# Patient Record
Sex: Male | Born: 1990 | Race: White | Hispanic: No | Marital: Single | Smoking: Current every day smoker
Health system: Southern US, Community
[De-identification: ages and names within clinical notes are randomized; demographics above are authoritative.]

---
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
## 2016-08-17 ENCOUNTER — Encounter (HOSPITAL_COMMUNITY): Payer: Self-pay | Admitting: Emergency Medicine

## 2016-08-17 ENCOUNTER — Ambulatory Visit (HOSPITAL_COMMUNITY)
Admission: EM | Admit: 2016-08-17 | Discharge: 2016-08-18 | Disposition: A | Discharge status: Home / Self Care | Payer: Self-pay | Attending: Orthopedic Surgery | Admitting: Orthopedic Surgery

## 2016-08-17 ENCOUNTER — Encounter (HOSPITAL_COMMUNITY): Payer: Self-pay | Admitting: Certified Registered Nurse Anesthetist

## 2016-08-17 ENCOUNTER — Emergency Department (HOSPITAL_COMMUNITY): Payer: Self-pay

## 2016-08-17 DIAGNOSIS — F17210 Nicotine dependence, cigarettes, uncomplicated: Secondary | ICD-10-CM | POA: Insufficient documentation

## 2016-08-17 DIAGNOSIS — S62609A Fracture of unspecified phalanx of unspecified finger, initial encounter for closed fracture: Secondary | ICD-10-CM

## 2016-08-17 DIAGNOSIS — Y93H3 Activity, building and construction: Secondary | ICD-10-CM | POA: Insufficient documentation

## 2016-08-17 DIAGNOSIS — S62625B Displaced fracture of medial phalanx of left ring finger, initial encounter for open fracture: Secondary | ICD-10-CM | POA: Insufficient documentation

## 2016-08-17 DIAGNOSIS — W298XXA Contact with other powered powered hand tools and household machinery, initial encounter: Secondary | ICD-10-CM | POA: Insufficient documentation

## 2016-08-17 DIAGNOSIS — Z23 Encounter for immunization: Secondary | ICD-10-CM | POA: Insufficient documentation

## 2016-08-17 DIAGNOSIS — S63295A Dislocation of distal interphalangeal joint of left ring finger, initial encounter: Secondary | ICD-10-CM | POA: Insufficient documentation

## 2016-08-17 DIAGNOSIS — Z79899 Other long term (current) drug therapy: Secondary | ICD-10-CM | POA: Insufficient documentation

## 2016-08-17 DIAGNOSIS — S61215A Laceration without foreign body of left ring finger without damage to nail, initial encounter: Secondary | ICD-10-CM

## 2016-08-17 DIAGNOSIS — S62635B Displaced fracture of distal phalanx of left ring finger, initial encounter for open fracture: Secondary | ICD-10-CM | POA: Diagnosis present

## 2016-08-17 LAB — BASIC METABOLIC PANEL
Anion gap: 8 (ref 5–15)
BUN: 15 mg/dL (ref 6–20)
CHLORIDE: 102 mmol/L (ref 101–111)
CO2: 28 mmol/L (ref 22–32)
CREATININE: 1.09 mg/dL (ref 0.61–1.24)
Calcium: 9.1 mg/dL (ref 8.9–10.3)
GFR calc Af Amer: >60 mL/min (ref >60)
GFR calc non Af Amer: >60 mL/min (ref >60)
GLUCOSE: 127 mg/dL — AB (ref 65–99)
Potassium: 3.3 mmol/L — ABNORMAL LOW (ref 3.5–5.1)
SODIUM: 138 mmol/L (ref 135–145)

## 2016-08-17 LAB — CBC WITH DIFFERENTIAL/PLATELET
Basophils Absolute: 0 10*3/uL (ref 0.0–0.1)
Basophils Relative: 0 %
EOS ABS: 0.2 10*3/uL (ref 0.0–0.7)
EOS PCT: 2 %
HEMATOCRIT: 45 % (ref 39.0–52.0)
HEMOGLOBIN: 15.8 g/dL (ref 13.0–17.0)
LYMPHS ABS: 3.2 10*3/uL (ref 0.7–4.0)
Lymphocytes Relative: 33 %
MCH: 30.7 pg (ref 26.0–34.0)
MCHC: 35.1 g/dL (ref 30.0–36.0)
MCV: 87.4 fL (ref 78.0–100.0)
MONO ABS: 0.6 10*3/uL (ref 0.1–1.0)
MONOS PCT: 6 %
Neutro Abs: 5.5 10*3/uL (ref 1.7–7.7)
Neutrophils Relative %: 59 %
Platelets: 153 10*3/uL (ref 150–400)
RBC: 5.15 MIL/uL (ref 4.22–5.81)
RDW: 12.9 % (ref 11.5–15.5)
WBC: 9.5 10*3/uL (ref 4.0–10.5)

## 2016-08-17 MED ORDER — CEFAZOLIN SODIUM-DEXTROSE 2-4 GM/100ML-% IV SOLN
INTRAVENOUS | Status: AC
  Filled 2016-08-17: qty 100

## 2016-08-17 MED ORDER — MORPHINE SULFATE (PF) 4 MG/ML IV SOLN
4.0000 mg | Freq: Once | INTRAVENOUS | Status: AC
  Administered 2016-08-17: 4 mg via INTRAVENOUS
  Filled 2016-08-17: qty 1

## 2016-08-17 MED ORDER — BUPIVACAINE HCL 0.5 % IJ SOLN
5.0000 mL | Freq: Once | INTRAMUSCULAR | Status: AC
  Administered 2016-08-17: 5 mL
  Filled 2016-08-17: qty 5

## 2016-08-17 MED ORDER — DEXAMETHASONE SODIUM PHOSPHATE 10 MG/ML IJ SOLN
INTRAMUSCULAR | Status: AC
  Filled 2016-08-17: qty 1

## 2016-08-17 MED ORDER — SODIUM CHLORIDE 0.9 % IV BOLUS (SEPSIS)
1000.0000 mL | Freq: Once | INTRAVENOUS | Status: AC
  Administered 2016-08-17: 1000 mL via INTRAVENOUS

## 2016-08-17 MED ORDER — ONDANSETRON HCL 4 MG/2ML IJ SOLN
INTRAMUSCULAR | Status: AC
  Filled 2016-08-17: qty 2

## 2016-08-17 MED ORDER — CEFAZOLIN IN D5W 1 GM/50ML IV SOLN
1.0000 g | INTRAVENOUS | Status: AC
  Administered 2016-08-18: 1 g via INTRAVENOUS

## 2016-08-17 MED ORDER — FENTANYL CITRATE (PF) 100 MCG/2ML IJ SOLN
INTRAMUSCULAR | Status: AC
  Filled 2016-08-17: qty 2

## 2016-08-17 MED ORDER — MIDAZOLAM HCL 2 MG/2ML IJ SOLN
INTRAMUSCULAR | Status: AC
  Filled 2016-08-17: qty 2

## 2016-08-17 MED ORDER — TETANUS-DIPHTH-ACELL PERTUSSIS 5-2.5-18.5 LF-MCG/0.5 IM SUSP
0.5000 mL | Freq: Once | INTRAMUSCULAR | Status: AC
  Administered 2016-08-17: 0.5 mL via INTRAMUSCULAR
  Filled 2016-08-17: qty 0.5

## 2016-08-17 MED ORDER — LIDOCAINE 2% (20 MG/ML) 5 ML SYRINGE
INTRAMUSCULAR | Status: AC
  Filled 2016-08-17: qty 5

## 2016-08-17 MED ORDER — CEFAZOLIN IN D5W 1 GM/50ML IV SOLN
1.0000 g | Freq: Once | INTRAVENOUS | Status: AC
  Administered 2016-08-17: 1 g via INTRAVENOUS
  Filled 2016-08-17: qty 50

## 2016-08-17 MED ORDER — ONDANSETRON HCL 4 MG/2ML IJ SOLN
4.0000 mg | Freq: Once | INTRAMUSCULAR | Status: AC
  Administered 2016-08-17: 4 mg via INTRAVENOUS
  Filled 2016-08-17: qty 2

## 2016-08-17 MED ORDER — PROPOFOL 10 MG/ML IV BOLUS
INTRAVENOUS | Status: AC
  Filled 2016-08-17: qty 20

## 2016-08-17 NOTE — Consult Note (Signed)
ORTHOPAEDIC CONSULTATION HISTORY & PHYSICAL REQUESTING PHYSICIAN: Rolland Porter, MD  Chief Complaint: left ring finger injury  HPI: Raymond Clark is a 26 y.o. male who was using an angle grinder on wood when his left ring finger was injured.  He presented to [facility] ED for treatment.  Ancef has been administered and his tetanus has been updated.  He has an open wound dorsally on the digit and the tip is floppy  History reviewed. No pertinent past medical history. History reviewed. No pertinent surgical history. Social History   Social History  . Marital status: Single    Spouse name: N/A  . Number of children: N/A  . Years of education: N/A   Social History Main Topics  . Smoking status: Current Every Day Smoker    Packs/day: 1.00    Types: Cigarettes  . Smokeless tobacco: Never Used  . Alcohol use No  . Drug use: No  . Sexual activity: Not Asked   Other Topics Concern  . None   Social History Narrative  . None   History reviewed. No pertinent family history. No Known Allergies Prior to Admission medications   Medication Sig Start Date End Date Taking? Authorizing Provider  amphetamine-dextroamphetamine (ADDERALL) 30 MG tablet Take 30 mg by mouth every morning.   Yes Historical Provider, MD   Dg Finger Ring Left  Result Date: 08/17/2016 CLINICAL DATA:  Injury to the fourth digit EXAM: LEFT RING FINGER 2+V COMPARISON:  None. FINDINGS: Severely comminuted fracture involving the head and distal shaft of the fourth middle phalanx with articular involvement. Multiple displaced bone fragments along the volar aspect of the digit. There is dislocation at the DIP joint with the distal phalanx positions volar to the residual head of the middle phalanx. No radiopaque foreign body. IMPRESSION: 1. Severely comminuted fracture involving the head and distal shaft of the fourth middle phalanx with multiple displaced bone fragments. 2. Dislocation at the fourth DIP joint with the distal  phalanx positioned volar to the residual head of the fourth middle phalanx. Electronically Signed   By: Jasmine Pang M.D.   On: 08/17/2016 22:52    Positive ROS: All other systems have been reviewed and were otherwise negative with the exception of those mentioned in the HPI and as above.  Physical Exam: Vitals: Refer to EMR. Constitutional:  WD, WN, NAD HEENT:  NCAT, EOMI Neuro/Psych:  Alert & oriented to person, place, and time; appropriate mood & affect Lymphatic: No generalized extremity edema or lymphadenopathy Extremities / MSK:  The extremities are normal with respect to appearance, ranges of motion, joint stability, muscle strength/tone, sensation, & perfusion except as otherwise noted:  L RF with oblique dorsal wound, intact volar skin, at level of DIPJ.  Intact LT sens R/U tip, but tip is floppy and lies in radial angulation.  There may be some dorsal soft tissue loss.  Assessment: L RF traumatic destruction of DIPJ from angle grinding machine, with intact volar structures/sensation  Plan: To OR for I&D and likely primary DIPJ arthrodesis.  In the process the middle phalanx can be shortened sufficiently to allow for primary ST closure.  G/R/O reviewed and consent obtained.  Will be kept for less than 24 hours for a couple of postop doses of IV antibiotics and D/C later on Sunday.   Cliffton Asters Janee Morn, MD      Orthopaedic & Hand Surgery [facility] [address] [place], [place]  [address] Office: [phone] Mobile: [phone]  08/17/2016, 11:30  PM    

## 2016-08-17 NOTE — ED Provider Notes (Signed)
[facility]-EMERGENCY DEPT Provider Note   CSN: [id number] Arrival date & time: 08/17/16  2207     History   Chief Complaint Chief Complaint  Patient presents with  . Laceration  . Finger Injury    HPI Raymond Clark is a 26 y.o. male.  HPI 26 year old Caucasian male with no significant past medical history presents to the ED today with complaints of left ring finger injury. Patient states he was using an angle grinder on wood when his left ring finger was hit by the blade. Patient endorses sensation at the tip of the finger. Denies any range of motion. Bleeding has been controlled. He denies any numbness or tingling. Unknown last tetanus. Denies any fever, chills, nausea, vomiting. History reviewed. No pertinent past medical history.  There are no active problems to display for this patient.   History reviewed. No pertinent surgical history.     Home Medications    Prior to Admission medications   Medication Sig Start Date End Date Taking? Authorizing Provider  amphetamine-dextroamphetamine (ADDERALL) 30 MG tablet Take 30 mg by mouth every morning.   Yes Historical Provider, MD    Family History History reviewed. No pertinent family history.  Social History Social History  Substance Use Topics  . Smoking status: Current Every Day Smoker    Packs/day: 1.00    Types: Cigarettes  . Smokeless tobacco: Never Used  . Alcohol use No     Allergies   Patient has no known allergies.   Review of Systems Review of Systems  Constitutional: Negative for chills and fever.  Eyes: Negative for visual disturbance.  Respiratory: Negative for cough and shortness of breath.   Cardiovascular: Negative for chest pain.  Gastrointestinal: Negative for abdominal pain, diarrhea, nausea and vomiting.  Skin: Positive for wound.  Neurological: Negative for dizziness, syncope, weakness, light-headedness, numbness and headaches.     Physical Exam Updated Vital Signs BP 119/71 (BP  Location: Right Arm)   Pulse 81   Temp 98.1 F (36.7 C) (Oral)   Resp 16   Ht 6' (1.829 m)   Wt 113.4 kg   SpO2 100%   BMI 33.91 kg/m   Physical Exam  Constitutional: He is oriented to person, place, and time. He appears well-developed and well-nourished. No distress.  HENT:  Head: Normocephalic and atraumatic.  Eyes: Right eye exhibits no discharge. Left eye exhibits no discharge. No scleral icterus.  Neck: Normal range of motion. Neck supple.  Cardiovascular: Intact distal pulses.   Pulmonary/Chest: No respiratory distress.  Musculoskeletal: Normal range of motion.  Laceration to the dorsal left ring finger. Intact volar skin. Laceration to the level of the DIP joint. Sensation intact to the radial and ulnar side of the tip of the finger. No range of motion with flexion and extension the DIP. Limited range of motion of flexion and extension of the PIP. The tip is floppy and lies in flexion when lifted up. Cap refill is normal. Radial pulses are 2+ bilaterally.     Neurological: He is alert and oriented to person, place, and time.  Skin: Skin is warm and dry. Capillary refill takes less than 2 seconds. No pallor.  Nursing note and vitals reviewed.       ED Treatments / Results  Labs (all labs ordered are listed, but only abnormal results are displayed) Labs Reviewed  BASIC METABOLIC PANEL - Abnormal; Notable for the following:       Result Value   Potassium 3.3 (*)  Glucose, Bld 127 (*)    All other components within normal limits  CBC WITH DIFFERENTIAL/PLATELET    EKG  EKG Interpretation None       Radiology Dg Finger Ring Left  Result Date: 08/18/2016 CLINICAL DATA:  ORIF EXAM: LEFT RING FINGER 2+V COMPARISON:  08/17/2016 FINDINGS: Total fluoroscopy time was 35.7 seconds. Two low resolution intraoperative spot views of the left ring finger are submitted. The images demonstrate external fixation of the comminuted fracture involving the distal shaft and head  of the fourth middle phalanx. Reduction of previously noted dislocation at the DIP joint. IMPRESSION: Intraoperative fluoroscopic assistance provided during fourth finger fracture fixation Electronically Signed   By: Jasmine Pang M.D.   On: 08/18/2016 01:36   Dg Finger Ring Left  Result Date: 08/17/2016 CLINICAL DATA:  Injury to the fourth digit EXAM: LEFT RING FINGER 2+V COMPARISON:  None. FINDINGS: Severely comminuted fracture involving the head and distal shaft of the fourth middle phalanx with articular involvement. Multiple displaced bone fragments along the volar aspect of the digit. There is dislocation at the DIP joint with the distal phalanx positions volar to the residual head of the middle phalanx. No radiopaque foreign body. IMPRESSION: 1. Severely comminuted fracture involving the head and distal shaft of the fourth middle phalanx with multiple displaced bone fragments. 2. Dislocation at the fourth DIP joint with the distal phalanx positioned volar to the residual head of the fourth middle phalanx. Electronically Signed   By: Jasmine Pang M.D.   On: 08/17/2016 22:52   Dg C-arm 1-60 Min-no Report  Result Date: 08/18/2016 Fluoroscopy was utilized by the requesting physician.  No radiographic interpretation.    Procedures Procedures (including critical care time)  Medications Ordered in ED Medications  ceFAZolin (ANCEF) 2-4 GM/100ML-% IVPB (not administered)  amphetamine-dextroamphetamine (ADDERALL) tablet 30 mg (30 mg Oral Not Given 08/18/16 0929)  HYDROmorphone (DILAUDID) injection 1 mg (1 mg Intravenous Given 08/18/16 0336)  ondansetron (ZOFRAN) tablet 4 mg (not administered)    Or  ondansetron (ZOFRAN) injection 4 mg (not administered)  acetaminophen (TYLENOL) tablet 650 mg (650 mg Oral Given 08/18/16 0929)  oxyCODONE (Oxy IR/ROXICODONE) immediate release tablet 5 mg (5 mg Oral Given 08/18/16 1313)  Tdap (BOOSTRIX) injection 0.5 mL (0.5 mLs Intramuscular Given 08/17/16 2247)   bupivacaine (MARCAINE) 0.5 % (with pres) injection 5 mL (5 mLs Infiltration Given 08/17/16 2250)  sodium chloride 0.9 % bolus 1,000 mL (1,000 mLs Intravenous New Bag/Given 08/17/16 2305)  morphine 4 MG/ML injection 4 mg (4 mg Intravenous Given 08/17/16 2304)  ondansetron (ZOFRAN) injection 4 mg (4 mg Intravenous Given 08/17/16 2304)  ceFAZolin (ANCEF) IVPB 1 g/50 mL premix (1 g Intravenous New Bag/Given 08/17/16 2304)  ceFAZolin (ANCEF) IVPB 1 g/50 mL premix (1 g Intravenous Given 08/18/16 0040)  ceFAZolin (ANCEF) IVPB 2g/100 mL premix (2 g Intravenous New Bag/Given 08/18/16 1315)     Initial Impression / Assessment and Plan / ED Course  I have reviewed the triage vital signs and the nursing notes.  Pertinent labs & imaging results that were available during my care of the patient were reviewed by me and considered in my medical decision making (see chart for details).     Patient presents to the ED with complaints of laceration to left ring finger after being involved in angle saw. Tetanus is unknown. DTaP was updated. X-ray shows a severely comminuted fracture involving the head and distal shaft of the fourth middle phalanx with multiple displaced bone fragments. Dislocation of  the fourth DIP joint. Ancef was given. Patient is neurovascularly intact however no range of motion likely tendon injury. Consult Dr. Janee Morn with hand surgery who agrees to take patient to the OR for surgical intervention. Patient updated on plan of care. He was hemodynamically stable on transport to the OR. Patient discussed with Dr. Fayrene Fearing who is agreeable to the above plan.  Final Clinical Impressions(s) / ED Diagnoses   Final diagnoses:  Laceration of left ring finger without damage to nail, foreign body presence unspecified, initial encounter    New Prescriptions New Prescriptions   No medications on file     Rise Mu, PA-C 08/17/16 2313    Rise Mu, PA-C 08/18/16 1455    Rolland Porter, MD 08/31/16 1600

## 2016-08-17 NOTE — Op Note (Signed)
08/25/2016  11:40 PM  PATIENT:  Raymond Clark  26 y.o. male  PRE-OPERATIVE DIAGNOSIS:  L RF open comminuted fx-dx at DIPJ with joint destruction  POST-OPERATIVE DIAGNOSIS:  Same  PROCEDURE:   1. L RF exicisional debridement of open fx -Sk, SQ, T, B     2. L RF primary DIPJ arthrodesis    3. L RF intermediate closure of traumatic laceration   SURGEON: Rayvon Char. Grandville Silos, MD  PHYSICIAN ASSISTANT: None  ANESTHESIA:  local and MAC  SPECIMENS:  None  DRAINS:   None  EBL:  less than 50 mL  PREOPERATIVE INDICATIONS:  VERONICA GUERRANT is a  26 y.o. male with a devastating injury to the L RF DIPJ from an angle grinder  The risks benefits and alternatives were discussed with the patient preoperatively including but not limited to the risks of infection, bleeding, nerve injury, cardiopulmonary complications, the need for revision surgery, among others, and the patient verbalized understanding and consented to proceed.  OPERATIVE IMPLANTS: 0.045 in kwires x 2  OPERATIVE PROCEDURE:  After receiving prophylactic antibiotics, the patient was escorted to the operative theatre and placed in a supine position.  He was sedated and a digital block was performed by me. A surgical "time-out" was performed during which the planned procedure, proposed operative site, and the correct patient identity were compared to the operative consent and agreement confirmed by the circulating nurse according to current facility policy.  Following application of a tourniquet to the operative extremity, the exposed skin was pre-scrubbed with Hibiclens scrub brush before being formally prepped with Betadine and draped in the usual sterile fashion.  The limb was exsanguinated with gravity and the tourniquet inflated to approximately 174mHg higher than systolic BP.  I first performed excisional debridement of the jagged irregular skin edges to include full-thickness skin and subcutaneous tissues.  The bone was  inspected, and there was ground in dirt in the distal portion of the middle phalanx.  There were 2 small condylar fragments remaining attached to soft tissues and these were excised and used later for bone graft.  In addition, the dorsal cortex, particularly on the radial side was absent.  I used a curette to remove the chondral surfaces from the base of the distal phalanx.  The remaining portion of the middle phalanx was cut transversely with a bone cutter in preparation for fusion and to allow closure of the soft tissues over the site..  This bone was also saved for later grafting.  A 0.045 inch K wire was driven from the tip of the digit, across the DIP joint, with an attempt to make in slight flexion.  A second one was driven parallel to the first.  Final fluoroscopic images were obtained.  The fusion site was well approximated and was stable.  The dorsal half of the base of the distal phalanx was morselized with a rongeur and the bone that had been removed was also treated the same way and packed into the arthrodesis site.  The wound was again copiously irrigated and the skin closed with 4-0 Vicryl Rapide interrupted sutures.  The K wires were clipped short and allowed to retract below the skin surface.  A digital splint dressing was applied with a volar tongue blade component, allowing the MP and PIP joint to move freely.  He was taken to the recovery room in stable condition.  DISPOSITION: He'll be kept for 2 additional doses of IV antibiotics and then discharged later today after the  afternoon dose.  My office will contact him to arrange follow-up in 10-15 days, at which time we should get new x-rays of the left ring finger out of the splint and have a follow-on hand therapy appointment at [facility] to have a custom protective splinting fabricated.

## 2016-08-17 NOTE — Discharge Instructions (Signed)
Discharge Instructions ° ° °You have a dressing with a splint incorporated in it. °Move your fingers as much as possible, making a full fist and fully opening the fist. °Elevate your hand to reduce pain & swelling of the digits.  Ice over the operative site may be helpful to reduce pain & swelling.  DO NOT USE HEAT. °Pain medicine has been prescribed for you.  °Use your medicine as needed over the first 48 hours, and then you can begin to taper your use.  You may use Tylenol in place of your prescribed pain medication, but not IN ADDITION to it. °Leave the dressing in place until you return to our office.  °You may shower, but keep the bandage clean & dry.  °You may drive a car when you are off of prescription pain medications and can safely control your vehicle with both hands. °Our office will call you to arrange follow-up ° ° °Please call [phone] during normal business hours or [phone] after hours for any problems. Including the following: ° °- excessive redness of the incisions °- drainage for more than 4 days °- fever of more than 101.5 F ° °*Please note that pain medications will not be refilled after hours or on weekends. ° °

## 2016-08-17 NOTE — ED Triage Notes (Signed)
Pt reports that he was using an angle grinder and cut left 4th digit appx 30 min ago. Pt reports to cutting piece of wood at the time. Pt has no movement distally from second knuckle.

## 2016-08-18 ENCOUNTER — Emergency Department (HOSPITAL_COMMUNITY): Payer: Self-pay

## 2016-08-18 ENCOUNTER — Emergency Department (HOSPITAL_COMMUNITY): Payer: Self-pay | Admitting: Anesthesiology

## 2016-08-18 ENCOUNTER — Encounter (HOSPITAL_COMMUNITY)
Admission: EM | Disposition: A | Discharge status: Home / Self Care | Payer: Self-pay | Source: Home / Self Care | Attending: Emergency Medicine

## 2016-08-18 DIAGNOSIS — S62635B Displaced fracture of distal phalanx of left ring finger, initial encounter for open fracture: Secondary | ICD-10-CM | POA: Diagnosis present

## 2016-08-18 HISTORY — PX: OPEN REDUCTION INTERNAL FIXATION (ORIF) FINGER WITH RADIAL BONE GRAFT: SHX5666

## 2016-08-18 SURGERY — OPEN REDUCTION INTERNAL FIXATION (ORIF) FINGER WITH RADIAL BONE GRAFT
Anesthesia: Monitor Anesthesia Care | Laterality: Left

## 2016-08-18 MED ORDER — LIDOCAINE HCL 1 % IJ SOLN
INTRAMUSCULAR | Status: AC
  Filled 2016-08-18: qty 20

## 2016-08-18 MED ORDER — ONDANSETRON HCL 4 MG/2ML IJ SOLN
INTRAMUSCULAR | Status: DC | PRN
  Administered 2016-08-18: 4 mg via INTRAVENOUS

## 2016-08-18 MED ORDER — ACETAMINOPHEN 325 MG PO TABS: 650.0000 mg | ORAL_TABLET | Freq: Four times a day (QID) | ORAL | Status: AC | PRN

## 2016-08-18 MED ORDER — ONDANSETRON HCL 4 MG/2ML IJ SOLN: 4.0000 mg | Freq: Four times a day (QID) | INTRAMUSCULAR | Status: DC | PRN

## 2016-08-18 MED ORDER — PROPOFOL 10 MG/ML IV BOLUS
INTRAVENOUS | Status: AC
  Filled 2016-08-18: qty 20

## 2016-08-18 MED ORDER — HYDROMORPHONE HCL 4 MG/ML IJ SOLN
0.5000 mg | INTRAMUSCULAR | Status: DC | PRN
  Administered 2016-08-18: 1 mg via INTRAVENOUS
  Filled 2016-08-18: qty 1

## 2016-08-18 MED ORDER — PROPOFOL 10 MG/ML IV BOLUS
INTRAVENOUS | Status: DC | PRN
  Administered 2016-08-18 (×3): 10 mg via INTRAVENOUS
  Administered 2016-08-18: 50 mg via INTRAVENOUS
  Administered 2016-08-18 (×5): 10 mg via INTRAVENOUS

## 2016-08-18 MED ORDER — OXYCODONE HCL 5 MG PO TABS: 5.0000 mg | ORAL_TABLET | Freq: Four times a day (QID) | ORAL | 0 refills | Status: AC | PRN

## 2016-08-18 MED ORDER — ONDANSETRON HCL 4 MG PO TABS: 4.0000 mg | ORAL_TABLET | Freq: Four times a day (QID) | ORAL | Status: DC | PRN

## 2016-08-18 MED ORDER — PROMETHAZINE HCL 25 MG/ML IJ SOLN
6.2500 mg | INTRAMUSCULAR | Status: DC | PRN
  Filled 2016-08-18: qty 1

## 2016-08-18 MED ORDER — FENTANYL CITRATE (PF) 100 MCG/2ML IJ SOLN: 25.0000 ug | INTRAMUSCULAR | Status: DC | PRN

## 2016-08-18 MED ORDER — SODIUM CHLORIDE 0.9 % IR SOLN
Status: DC | PRN
  Administered 2016-08-18: 1000 mL

## 2016-08-18 MED ORDER — LIDOCAINE HCL (PF) 1 % IJ SOLN
INTRAMUSCULAR | Status: DC | PRN
  Administered 2016-08-18: 20 mL

## 2016-08-18 MED ORDER — MIDAZOLAM HCL 5 MG/5ML IJ SOLN
INTRAMUSCULAR | Status: DC | PRN
  Administered 2016-08-18 (×2): 1 mg via INTRAVENOUS

## 2016-08-18 MED ORDER — ACETAMINOPHEN 325 MG PO TABS
650.0000 mg | ORAL_TABLET | Freq: Four times a day (QID) | ORAL | Status: DC
Start: 2016-08-18 — End: 2016-08-18
  Administered 2016-08-18 (×2): 650 mg via ORAL
  Filled 2016-08-18 (×2): qty 2

## 2016-08-18 MED ORDER — FENTANYL CITRATE (PF) 100 MCG/2ML IJ SOLN
INTRAMUSCULAR | Status: AC
  Filled 2016-08-18: qty 2

## 2016-08-18 MED ORDER — LACTATED RINGERS IV SOLN
INTRAVENOUS | Status: DC | PRN
  Administered 2016-08-18: 01:00:00 via INTRAVENOUS

## 2016-08-18 MED ORDER — OXYCODONE HCL 5 MG PO TABS
5.0000 mg | ORAL_TABLET | Freq: Four times a day (QID) | ORAL | Status: DC | PRN
  Administered 2016-08-18 (×2): 5 mg via ORAL
  Filled 2016-08-18 (×2): qty 1

## 2016-08-18 MED ORDER — CEFAZOLIN SODIUM-DEXTROSE 2-4 GM/100ML-% IV SOLN
2.0000 g | Freq: Three times a day (TID) | INTRAVENOUS | Status: AC
  Administered 2016-08-18 (×2): 2 g via INTRAVENOUS
  Filled 2016-08-18 (×2): qty 100

## 2016-08-18 MED ORDER — AMPHETAMINE-DEXTROAMPHETAMINE 10 MG PO TABS
30.0000 mg | ORAL_TABLET | Freq: Every day | ORAL | Status: DC
  Filled 2016-08-18: qty 3

## 2016-08-18 SURGICAL SUPPLY — 23 items
BNDG COHESIVE 1X5 TAN STRL LF (GAUZE/BANDAGES/DRESSINGS) ×1 IMPLANT
BNDG CONFORM 2 STRL LF (GAUZE/BANDAGES/DRESSINGS) ×1 IMPLANT
CORDS BIPOLAR (ELECTRODE) ×1 IMPLANT
CUFF TOURN SGL QUICK 18 (TOURNIQUET CUFF) ×1 IMPLANT
DRAPE U-SHAPE 47X51 STRL (DRAPES) ×1 IMPLANT
DRSG EMULSION OIL 3X3 NADH (GAUZE/BANDAGES/DRESSINGS) ×1 IMPLANT
ELECT REM PT RETURN 9FT ADLT (ELECTROSURGICAL) ×2
ELECTRODE REM PT RTRN 9FT ADLT (ELECTROSURGICAL) IMPLANT
GAUZE SPONGE 2X2 8PLY STRL LF (GAUZE/BANDAGES/DRESSINGS) IMPLANT
GAUZE SPONGE 4X4 12PLY STRL (GAUZE/BANDAGES/DRESSINGS) ×1 IMPLANT
GLOVE BIOGEL PI IND STRL 6.5 (GLOVE) IMPLANT
GLOVE BIOGEL PI IND STRL 8 (GLOVE) IMPLANT
GLOVE BIOGEL PI INDICATOR 6.5 (GLOVE) ×1
GLOVE BIOGEL PI INDICATOR 8 (GLOVE) ×1
GLOVE ECLIPSE 7.5 STRL STRAW (GLOVE) ×1 IMPLANT
GOWN STRL REUS W/TWL LRG LVL3 (GOWN DISPOSABLE) ×3 IMPLANT
K-WIRE DBL TROCAR .045X4 ×4 IMPLANT
KIT BASIN OR (CUSTOM PROCEDURE TRAY) ×1 IMPLANT
KWIRE DBL TROCAR .045X4 IMPLANT
PACK ORTHO EXTREMITY (CUSTOM PROCEDURE TRAY) ×1 IMPLANT
SPONGE GAUZE 2X2 STER 10/PKG (GAUZE/BANDAGES/DRESSINGS) ×1
SUT VICRYL RAPIDE 4/0 PS 2 (SUTURE) ×1 IMPLANT
TOWEL OR 17X26 10 PK STRL BLUE (TOWEL DISPOSABLE) ×1 IMPLANT

## 2016-08-18 NOTE — ED Notes (Signed)
Patient transported to OR.

## 2016-08-18 NOTE — ED Notes (Signed)
All belongings given to [place] pt girlfriend per pt request. Pt state last solid food/ meal eaten at 2000 and last liquid consumed was at 2100 had cup of coffee. All clothing and jewelry removed and taken by girlfriend.

## 2016-08-18 NOTE — Anesthesia Preprocedure Evaluation (Signed)
Anesthesia Evaluation  Patient identified by MRN, date of birth, ID band Patient awake    Reviewed: Allergy & Precautions, NPO status , Patient's Chart, lab work & pertinent test results  Airway Mallampati: II  TM Distance: >3 FB Neck ROM: Full    Dental  (+) Teeth Intact, Dental Advisory Given   Pulmonary Current Smoker,    Pulmonary exam normal breath sounds clear to auscultation       Cardiovascular Exercise Tolerance: Good negative cardio ROS Normal cardiovascular exam Rhythm:Regular Rate:Normal     Neuro/Psych negative neurological ROS  negative psych ROS   GI/Hepatic negative GI ROS, Neg liver ROS,   Endo/Other  negative endocrine ROS  Renal/GU negative Renal ROS     Musculoskeletal negative musculoskeletal ROS (+)   Abdominal   Peds  Hematology negative hematology ROS (+)   Anesthesia Other Findings Day of surgery medications reviewed with the patient.  Reproductive/Obstetrics                             Anesthesia Physical Anesthesia Plan  ASA: II and emergent  Anesthesia Plan: MAC   Post-op Pain Management:    Induction: Intravenous  Airway Management Planned: Nasal Cannula  Additional Equipment:   Intra-op Plan:   Post-operative Plan:   Informed Consent: I have reviewed the patients History and Physical, chart, labs and discussed the procedure including the risks, benefits and alternatives for the proposed anesthesia with the patient or authorized representative who has indicated his/her understanding and acceptance.   Dental advisory given  Plan Discussed with: CRNA and Anesthesiologist  Anesthesia Plan Comments:         Anesthesia Quick Evaluation

## 2016-08-18 NOTE — Transfer of Care (Signed)
Immediate Anesthesia Transfer of Care Note  Patient: Raymond Clark  Procedure(s) Performed: Procedure(s): OPEN REDUCTION INTERNAL FIXATION (ORIF) FINGER with fusion left ringfinger (Left)  Patient Location: PACU  Anesthesia Type:MAC and Local  Level of Consciousness:  sedated, patient cooperative and responds to stimulation  Airway & Oxygen Therapy:Patient Spontanous Breathing and Patient connected to face mask oxgen  Post-op Assessment:  Report given to PACU RN and Post -op Vital signs reviewed and stable  Post vital signs:  Reviewed and stable  Last Vitals:  Vitals:   08/17/16 2357 08/18/16 0139  BP: (!) 145/98   Pulse: 100   Resp: 18   Temp: 36.6 C (P) 68.2 C    Complications: No apparent anesthesia complications

## 2016-08-18 NOTE — Anesthesia Postprocedure Evaluation (Signed)
Anesthesia Post Note  Patient: Raymond Clark  Procedure(s) Performed: Procedure(s) (LRB): OPEN REDUCTION INTERNAL FIXATION (ORIF) FINGER with fusion left ringfinger (Left)  Patient location during evaluation: PACU Anesthesia Type: MAC Level of consciousness: awake and alert Pain management: pain level controlled Vital Signs Assessment: post-procedure vital signs reviewed and stable Respiratory status: spontaneous breathing, nonlabored ventilation, respiratory function stable and patient connected to nasal cannula oxygen Cardiovascular status: stable and blood pressure returned to baseline Anesthetic complications: no       Last Vitals:  Vitals:   08/18/16 0622 08/18/16 1329  BP: 129/88 112/87  Pulse: (!) 107 92  Resp: 16 18  Temp: 36.6 C 36.6 C    Last Pain:  Vitals:   08/18/16 1329  TempSrc: Oral  PainSc:                  Catalina Gravel

## 2016-08-18 NOTE — Discharge Summary (Signed)
Physician Discharge Summary  Patient ID: TOBBY FAWCETT MRN: [id number] DOB/AGE: 28/28/1992 28 y.o.  Admit date: 08/17/2016 Discharge date: 08/18/2016  Admission Diagnoses:  Left ring finger open fracture  Discharge Diagnoses:  Active Problems:   Displaced fracture of distal phalanx of left ring finger, initial encounter for open fracture   History reviewed. No pertinent past medical history.  Surgeries: Procedure(s): OPEN REDUCTION INTERNAL FIXATION (ORIF) FINGER with fusion left ringfinger on 08/17/2016 - 08/18/2016   Consultants (if any):   Discharged Condition: Improved  Hospital Course: MIVAAN CORBITT III is an 26 y.o. male who was admitted 08/17/2016 with a diagnosis of <principal problem not specified> and went to the operating room on 08/17/2016 - 08/18/2016 and underwent the above named procedures.    He was given perioperative antibiotics:  Anti-infectives    Start     Dose/Rate Route Frequency Ordered Stop   08/18/16 0600  ceFAZolin (ANCEF) IVPB 1 g/50 mL premix     1 g 100 mL/hr over 30 Minutes Intravenous On call to O.R. 08/17/16 2330 08/18/16 0110   08/18/16 0600  ceFAZolin (ANCEF) IVPB 2g/100 mL premix     2 g 200 mL/hr over 30 Minutes Intravenous Every 8 hours 08/18/16 0307 08/18/16 2159   08/17/16 2355  ceFAZolin (ANCEF) 2-4 GM/100ML-% IVPB    Comments:  Kym Groom   : cabinet override      08/17/16 2355 08/18/16 1159   08/17/16 2300  ceFAZolin (ANCEF) IVPB 1 g/50 mL premix     1 g 100 mL/hr over 30 Minutes Intravenous  Once 08/17/16 2257 08/17/16 2334    .  He was given sequential compression devices, early ambulation, for DVT prophylaxis.  He was kept for several hours for extended recovery for 2 additional doses of IV antibiotics prior to discharge.  He benefited maximally from the hospital stay and there were no complications.    Recent vital signs:  Vitals:   08/18/16 0256 08/18/16 0622  BP: (!) 151/86 129/88  Pulse: 92 (!) 107  Resp: 18  16  Temp: 98 F (36.7 C) 97.8 F (36.6 C)    Recent laboratory studies:  Lab Results  Component Value Date   HGB 15.8 08/17/2016   Lab Results  Component Value Date   WBC 9.5 08/17/2016   PLT 153 08/17/2016   No results found for: INR Lab Results  Component Value Date   NA 138 08/17/2016   K 3.3 (L) 08/17/2016   CL 102 08/17/2016   CO2 28 08/17/2016   BUN 15 08/17/2016   CREATININE 1.09 08/17/2016   GLUCOSE 127 (H) 08/17/2016    Discharge Medications:   Oxycodone prn, tylenol, adderall  Diagnostic Studies: Dg Finger Ring Left  Result Date: 08/18/2016 CLINICAL DATA:  ORIF EXAM: LEFT RING FINGER 2+V COMPARISON:  08/17/2016 FINDINGS: Total fluoroscopy time was 35.7 seconds. Two low resolution intraoperative spot views of the left ring finger are submitted. The images demonstrate external fixation of the comminuted fracture involving the distal shaft and head of the fourth middle phalanx. Reduction of previously noted dislocation at the DIP joint. IMPRESSION: Intraoperative fluoroscopic assistance provided during fourth finger fracture fixation Electronically Signed   By: Jasmine Pang M.D.   On: 08/18/2016 01:36   Dg Finger Ring Left  Result Date: 08/17/2016 CLINICAL DATA:  Injury to the fourth digit EXAM: LEFT RING FINGER 2+V COMPARISON:  None. FINDINGS: Severely comminuted fracture involving the head and distal shaft of the fourth middle phalanx with articular  involvement. Multiple displaced bone fragments along the volar aspect of the digit. There is dislocation at the DIP joint with the distal phalanx positions volar to the residual head of the middle phalanx. No radiopaque foreign body. IMPRESSION: 1. Severely comminuted fracture involving the head and distal shaft of the fourth middle phalanx with multiple displaced bone fragments. 2. Dislocation at the fourth DIP joint with the distal phalanx positioned volar to the residual head of the fourth middle phalanx. Electronically  Signed   By: Jasmine Pang M.D.   On: 08/17/2016 22:52   Dg C-arm 1-60 Min-no Report  Result Date: 08/18/2016 Fluoroscopy was utilized by the requesting physician.  No radiographic interpretation.    Disposition: Final discharge disposition not confirmed    Follow-up Information    Matson Welch A., MD Follow up.   Specialty:  Orthopedic Surgery Why:  office will call you to make the next appointment Contact information: [address] [place] [address] [phone]            Signed: Marvon Shillingburg A. 08/18/2016, 9:08 AM

## 2016-08-18 NOTE — Progress Notes (Signed)
Patient discharged home.  Leaving with personal belongings and prescriptions. Patient and wife report understanding of discharge instructions.  Room air, no s/s of distress.  Accompanied by wife.  No complaints.

## 2016-08-18 NOTE — Progress Notes (Signed)
Pt arrived to 5 east, room 1516 from PACU. Pt is A&Ox4. Pt ambulated to bathroom independently. Has left ring finger dressing intact. Positive radial pulses bilaterally, good color to hand and able to move all fingers. Pt oriented to room, call bell, bed use and plan of care. Verbalized understanding. GF at bedside. Will continue to monitor.

## 2016-08-19 ENCOUNTER — Encounter (HOSPITAL_COMMUNITY): Payer: Self-pay | Admitting: Orthopedic Surgery

## 2016-08-25 SURGERY — MINOR OPEN REDUCTION INTERNAL FIXATION (ORIF) FINGER
Anesthesia: Choice | Laterality: Left

## 2016-08-26 ENCOUNTER — Encounter: Payer: Self-pay | Admitting: *Deleted

## 2016-08-26 ENCOUNTER — Ambulatory Visit: Payer: Self-pay | Attending: Orthopedic Surgery | Admitting: *Deleted

## 2016-08-26 DIAGNOSIS — R600 Localized edema: Secondary | ICD-10-CM

## 2016-08-26 DIAGNOSIS — M6281 Muscle weakness (generalized): Secondary | ICD-10-CM

## 2016-08-26 DIAGNOSIS — M79645 Pain in left finger(s): Secondary | ICD-10-CM

## 2016-08-26 DIAGNOSIS — R278 Other lack of coordination: Secondary | ICD-10-CM

## 2016-08-26 NOTE — Patient Instructions (Addendum)
WEARING SCHEDULE:  Wear splint at ALL times except for hygiene care (May remove splint for exercises and then immediately place back on ONLY if directed by the therapist)  PURPOSE:  To prevent movement and for protection until injury can heal  CARE OF SPLINT:  Keep splint away from heat sources including: stove, radiator or furnace, or a car in sunlight. The splint can melt and will no longer fit you properly  Keep away from pets and children  Clean the splint with rubbing alcohol 1-2 times per day.  * During this time, make sure you also clean your hand/arm as instructed by your therapist and/or perform dressing changes as needed. Then dry hand/arm completely before replacing splint. (When cleaning hand/arm, keep it immobilized in same position until splint is replaced)  PRECAUTIONS/POTENTIAL PROBLEMS: *If you notice or experience increased pain, swelling, numbness, or a lingering reddened area from the splint: Contact your therapist immediately by calling [phone]. You must wear the splint for protection, but we will get you scheduled for adjustments as quickly as possible.  (If only straps or hooks need to be replaced and NO adjustments to the splint need to be made, just call the office ahead and let them know you are coming in)  If you have any medical concerns or signs of infection, please call your doctor immediately   1) PIP Flexion (Active Blocked)    Hold large knuckle straight using other hand. Bend middle joint of ___Left Ring__ finger as far as possible. Hold __5__ seconds. Repeat __5-10__ times. Do __4-6__ sessions per day.  2) Do this same exercise with the back johint of your hand (Back Knuckle) of your left ring finger. Hold x5 seconds. Repeat 5-10 times, do this 4-6 times a day.   3) AROM: Flat Fist with splint On: Finger Flexion / Extension    With your splint on: Actively bend fingers of right hand. Start with knuckles furthest from palm, and slowly make a  fist. Hold __5__ seconds. Relax. Then straighten fingers as far as possible. Repeat _5-10___ times per set. Do __4-6__ sessions per day.  Copyright  VHI. All rights reserved.

## 2016-08-26 NOTE — Therapy (Signed)
[facility] [address] [place], [place], [address] Phone: [phone]   Fax:  [phone]  Occupational Therapy Evaluation  Patient Details  Name: Raymond Clark MRN: [id number] Date of Birth: 09/10/1990 Referring Provider: DR Mack Hook  Encounter Date: 08/26/2016      OT End of Session - 08/26/16 0911    Visit Number 1   Number of Visits 6   Authorization Type Self Pay (Pt does not qualify for Medicaid) Issued handout re: financial Assistance for pt to fill out   OT Start Time 0801   OT Stop Time 0900   OT Time Calculation (min) 59 min   Activity Tolerance Patient tolerated treatment well;No increased pain   Behavior During Therapy [facility] for tasks assessed/performed      History reviewed. No pertinent past medical history.  Past Surgical History:  Procedure Laterality Date  . OPEN REDUCTION INTERNAL FIXATION (ORIF) FINGER WITH RADIAL BONE GRAFT Left 08/18/2016   Procedure: OPEN REDUCTION INTERNAL FIXATION (ORIF) FINGER with fusion left ringfinger;  Surgeon: Mack Hook, MD;  Location: [facility];  Service: Orthopedics;  Laterality: Left;    There were no vitals filed for this visit.      Subjective Assessment - 08/26/16 0806    Subjective  Pt is a 26 y/o R HD male and he got his left non-dominant hand caught in an angle grinder when he was working in his garage. DOS was 08/17/16, he was admitted and d/c on 08/18/16. He is currently 1 week and 2 days post-op.   Currently in Pain? Yes   Pain Score 3    Pain Location Finger (Comment which one)   Pain Orientation Left   Pain Descriptors / Indicators Discomfort;Aching   Pain Type Surgical pain   Pain Onset In the past 7 days   Pain Frequency Intermittent   Aggravating Factors  Leaving hand down by his side   Pain Relieving Factors Elevation, pain medicine, currently out of tylenol and pain meds   Multiple Pain Sites No           [facility] OT  Assessment - 08/26/16 0001      Assessment   Diagnosis L RF ORIF w/ fusion following fracture of distal phalanx   Referring Provider DR Mack Hook   Onset Date 08/17/16     Precautions   Precautions Other (comment)   Precaution Comments Don't lift anything too heavy   Required Braces or Orthoses --  Pt is at out-pt OT for protective splinting     Balance Screen   Has the patient fallen in the past 6 months No   Has the patient had a decrease in activity level because of a fear of falling?  No   Is the patient reluctant to leave their home because of a fear of falling?  No     Home  Environment   Family/patient expects to be discharged to: Private residence   Lives With Significant other     Prior Function   Level of Independence Independent   Vocation Full time employment   Paediatric nurse at [employer]   Currently unable to work     Edema   Edema Mild edema noted left hand and RF     ROM / Strength   AROM / PROM / Strength AROM     Hand Function   Comment Impaired/Not formally assessed: Left RF AROM within protective splint is limited by edema and dressing.  Anticipate that AROM at PIP and MCP will be WFL's with performance of HEP/blocked Ex's and edema control techniques.                  OT Treatments/Exercises (OP) - 08/26/16 0001      Exercises   Exercises Hand     Hand Exercises   Tendon Glides Active blocked MCP and PIP Flexion as wel las Flat Fist within protective splint. Pt demonstrated in clinic and handout was provided (See pt instructions)     Splinting   Splinting A protective splint was fabricated for pt Left RF. Splint is volar based, places L RF DIP in extension. His post-op dressing was initially removed and he was with noted minimal edema and serosanguinous drainage along his dorsal RF surgical scar area. Pt/girlfriend were educated in possible signs and symptoms of infection, HEP and splinting use, care and  precautions. A handout was provided and reviewed in clinic today and pt was noted to be I don/doff protective splint. He will change dressing 1x/day or as MD instructs him later today. He verbalized understanding of all of the above.       WEARING SCHEDULE:  Wear splint at ALL times except for hygiene care (May remove splint for exercises and then immediately place back on ONLY if directed by the therapist)  PURPOSE:  To prevent movement and for protection until injury can heal  CARE OF SPLINT:  Keep splint away from heat sources including: stove, radiator or furnace, or a car in sunlight. The splint can melt and will no longer fit you properly  Keep away from pets and children  Clean the splint with rubbing alcohol 1-2 times per day.  * During this time, make sure you also clean your hand/arm as instructed by your therapist and/or perform dressing changes as needed. Then dry hand/arm completely before replacing splint. (When cleaning hand/arm, keep it immobilized in same position until splint is replaced)  PRECAUTIONS/POTENTIAL PROBLEMS: *If you notice or experience increased pain, swelling, numbness, or a lingering reddened area from the splint: Contact your therapist immediately by calling [phone]. You must wear the splint for protection, but we will get you scheduled for adjustments as quickly as possible.  (If only straps or hooks need to be replaced and NO adjustments to the splint need to be made, just call the office ahead and let them know you are coming in)  If you have any medical concerns or signs of infection, please call your doctor immediately   1) PIP Flexion (Active Blocked)    Hold large knuckle straight using other hand. Bend middle joint of ___Left Ring__ finger as far as possible. Hold __5__ seconds. Repeat __5-10__ times. Do __4-6__ sessions per day.  2) Do this same exercise with the back johint of your hand (Back Knuckle) of your left ring finger. Hold x5  seconds. Repeat 5-10 times, do this 4-6 times a day.   3) AROM: Flat Fist with splint On: Finger Flexion / Extension    With your splint on: Actively bend fingers of right hand. Start with knuckles furthest from palm, and slowly make a fist. Hold __5__ seconds. Relax. Then straighten fingers as far as possible. Repeat _5-10___ times per set. Do __4-6__ sessions per day.  Copyright  VHI. All rights reserved.           OT Education - 08/26/16 0910    Education provided Yes   Education Details Splinting use, care and precautions, dressing changes, possible  signs/symptoms of infection; active blocked flexion ex's of left MCP/PIP and flat fist within splint.    Person(s) Educated Patient;Other (comment)  Significant other/Girlfriend   Methods Explanation;Demonstration;Tactile cues;Verbal cues;Handout   Comprehension Verbalized understanding;Returned demonstration          OT Short Term Goals - 08/26/16 0922      OT SHORT TERM GOAL #1   Title STG's = LTG's            OT Long Term Goals - 08/26/16 0865      OT LONG TERM GOAL #1   Title Pt will be Mod I upgraded HEP L RF   Time 4   Period Weeks   Status New     OT LONG TERM GOAL #2   Title Pt will be Mod I scra management techniques L RF   Time 4   Period Weeks   Status New     OT LONG TERM GOAL #3   Title Pt will be Mod I edema control techniques L RF/L hand   Time 4   Period Weeks   Status New     OT LONG TERM GOAL #4   Title Pt will demonstrate functional flat fist & active ROM L RF at MCP and PIPJ for return to ADL's and work related activites    Time 4   Period Weeks   Status New     OT LONG TERM GOAL #5   Title Pt will demonstrate functional grip L non-dominant hand as flat fist 2* fusion of RF DIPJ   Time 4   Period Weeks   Status New               Plan - 08/26/16 0912    Clinical Impression Statement Pt is a pleasant 26 y/o RHD male whom sustained a Left communituted RF DIP fracture  w/ joint dislocation at home when his glove got caught in [employer]. He underwent ORIF w/ fusion at DIP on 08/17/16 and was d/c on 08/18/16 by Dr Mack Hook on 08/17/16. He presents today for protective splinting and HEP instruction. A custom volar splint was fabricated for his L RF. Splint allows for Active ROM to MCP and PIP of L RF. He was educated in splinting use, care and precautions, possible signs/symptoms of infection as well as dressing changes, HEP and edema control. Because pt is self pay, he was issued financial forms. He should benefit from out-pt OT to address protective splint needs and adjustments, as well as HEP instruction, edema control and scar management. He plans to f/u with Dr Janee Morn today, 08/26/16 at 12:30pm. He was scheduled to return here in 1 week ofr splint check, adjustment and upgrade of home program as able.   Rehab Potential Good   OT Frequency Other (comment)  Pt is self pay. Applying for financial assistance. Will schedule f/u 1x next week, up to 6 visits total.   OT Duration 4 weeks   OT Treatment/Interventions Self-care/ADL training;Therapeutic exercise;Parrafin;Splinting;Fluidtherapy;Scar mobilization;Therapeutic exercises;Patient/family education;Cryotherapy;Ultrasound;Manual Therapy;Therapeutic activities;Passive range of motion   Plan L RF Splint check and adjustments, scar and edema management, upgrade HEP PRN.   OT Home Exercise Plan See pt instructions   Consulted and Agree with Plan of Care Patient      Patient will benefit from skilled therapeutic intervention in order to improve the following deficits and impairments:  Increased edema, Impaired flexibility, Pain, Decreased coordination, Decreased scar mobility, Decreased strength, Decreased range of motion, Decreased activity tolerance, Decreased knowledge of precautions,  Impaired UE functional use  Visit Diagnosis: Pain in left finger(s) - Plan: Ot plan of care cert/re-cert  Localized edema -  Plan: Ot plan of care cert/re-cert  Other lack of coordination - Plan: Ot plan of care cert/re-cert  Muscle weakness (generalized) - Plan: Ot plan of care cert/re-cert    Problem List Patient Active Problem List   Diagnosis Date Noted  . Displaced fracture of distal phalanx of left ring finger, initial encounter for open fracture 08/18/2016    Charletta Cousin, Zahriah Roes Beth Dixon, OTR/L 08/26/2016, 9:28 AM  [facility] [address] [place], [place], [address] Phone: [phone]   Fax:  [phone]  Name: Raymond Clark MRN: [id number] Date of Birth: 1991/03/14

## 2016-09-02 ENCOUNTER — Ambulatory Visit: Payer: Self-pay | Admitting: Occupational Therapy

## 2016-09-03 ENCOUNTER — Encounter: Payer: Self-pay | Admitting: Occupational Therapy

## 2016-09-03 NOTE — Therapy (Signed)
[facility] [address] [place], [place], [address] Phone: [phone]   Fax:  [phone]  Patient Details  Name: Raymond BambergJohn F Cammack III MRN: [id number] Date of Birth: Oct 24, 1990 Referring Provider:  No ref. provider found  Encounter Date: 09/03/2016 Pt cancelled his appointment for 09/02/16 and did not schedule any follow up appointments, stating that he felt better and did not need to come to therapy. Pt will be d/c at this time.  Roselie AwkwardBarnhill, Dailon Sheeran Beth Dixon 09/03/2016, 10:26 AM  [facility] [address] [place], [place], [address] Phone: [phone]   Fax:  [phone]

## 2020-01-04 ENCOUNTER — Other Ambulatory Visit: Payer: Self-pay | Admitting: Family Medicine

## 2020-01-04 DIAGNOSIS — R222 Localized swelling, mass and lump, trunk: Secondary | ICD-10-CM

## 2020-01-07 ENCOUNTER — Other Ambulatory Visit: Payer: Self-pay

## 2020-01-12 ENCOUNTER — Ambulatory Visit
Admission: RE | Admit: 2020-01-12 | Discharge: 2020-01-12 | Disposition: A | Discharge status: Home / Self Care | Payer: 59 | Source: Ambulatory Visit | Attending: Family Medicine | Admitting: Family Medicine

## 2020-01-12 DIAGNOSIS — R222 Localized swelling, mass and lump, trunk: Secondary | ICD-10-CM

## 2022-05-06 IMAGING — US US SOFT TISSUE
1 series · 12 of 12 positions shown · non-contrast
Comparison: None.

CLINICAL DATA: Palpable lump along the left upper chest wall.

EXAM:
CHEST SOFT TISSUE ULTRASOUND

[Series 1: us soft tissue · 0.06mm/px · 12 acquisitions, 12 frames shown]
[im 1/12]
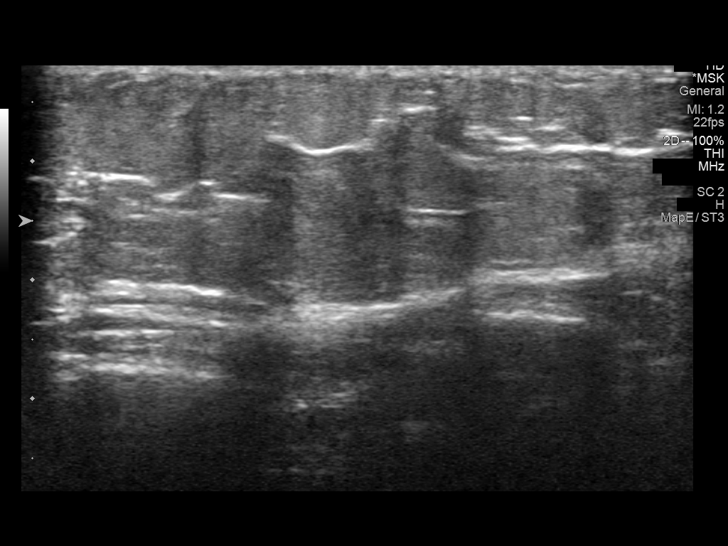
[im 2/12]
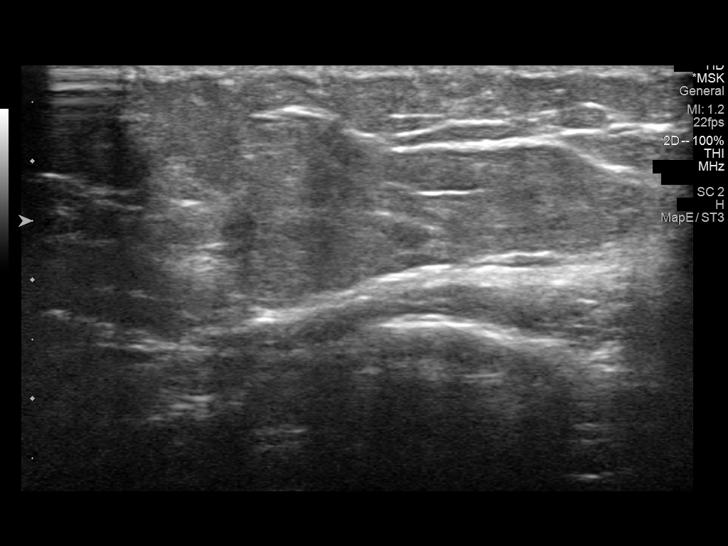
[im 3/12]
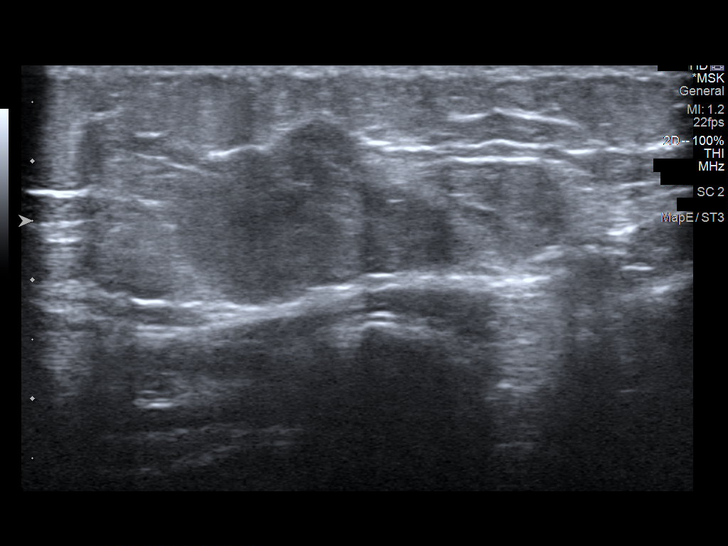
[im 4/12]
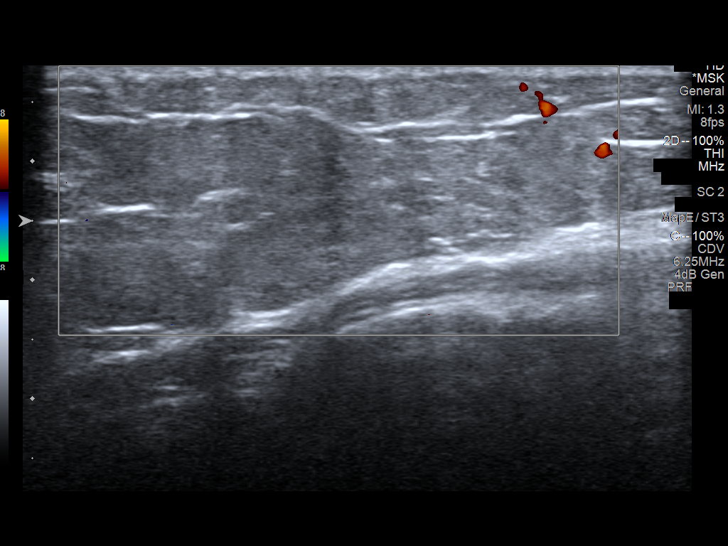
[im 5/12]
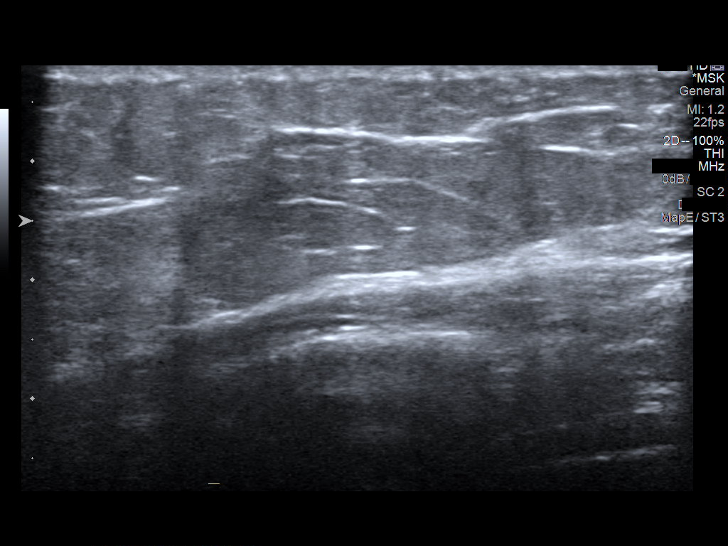
[im 6/12]
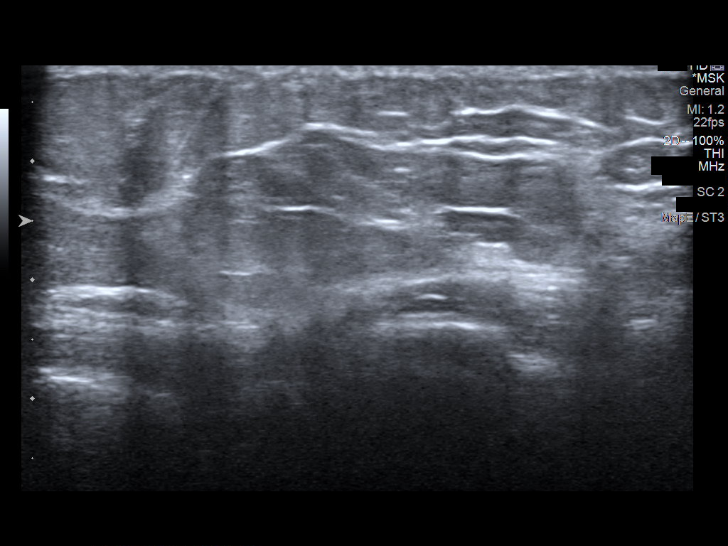
[im 7/12]
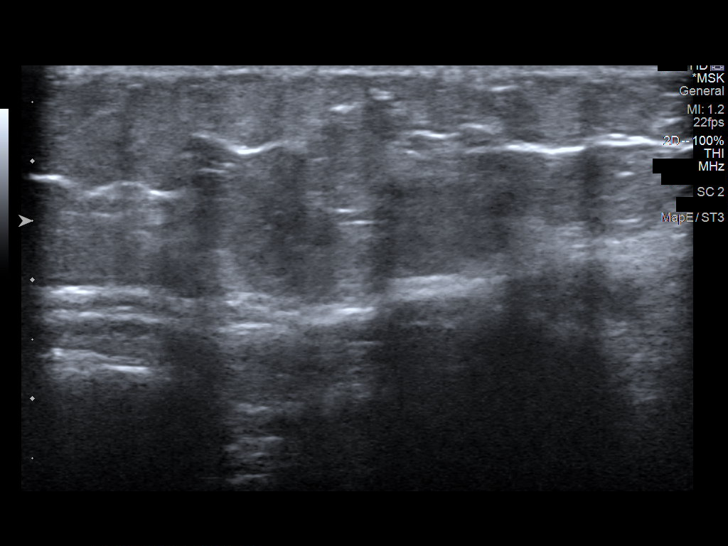
[im 8/12]
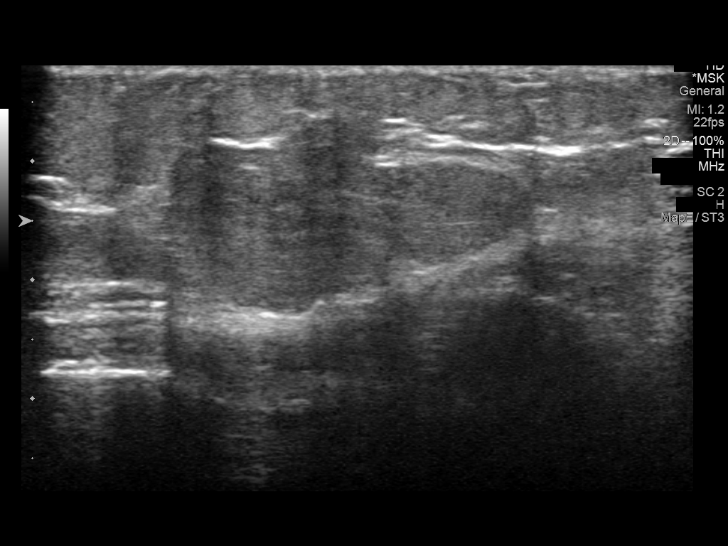
[im 9/12]
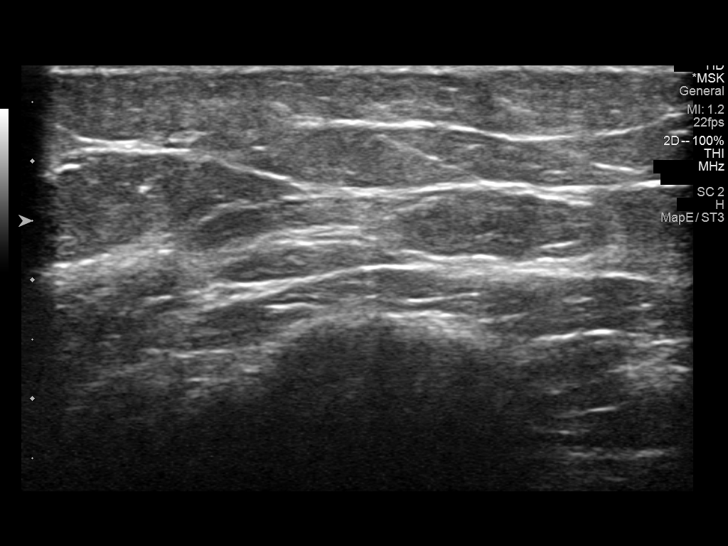
[im 10/12]
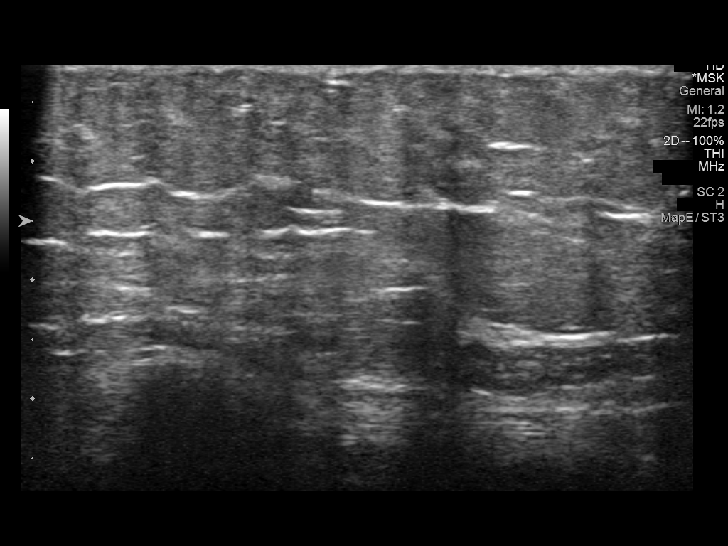
[im 11/12]
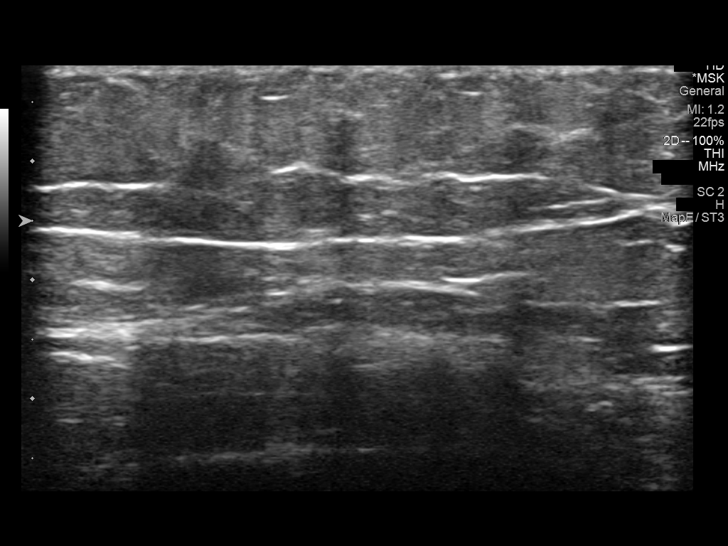
[im 12/12]
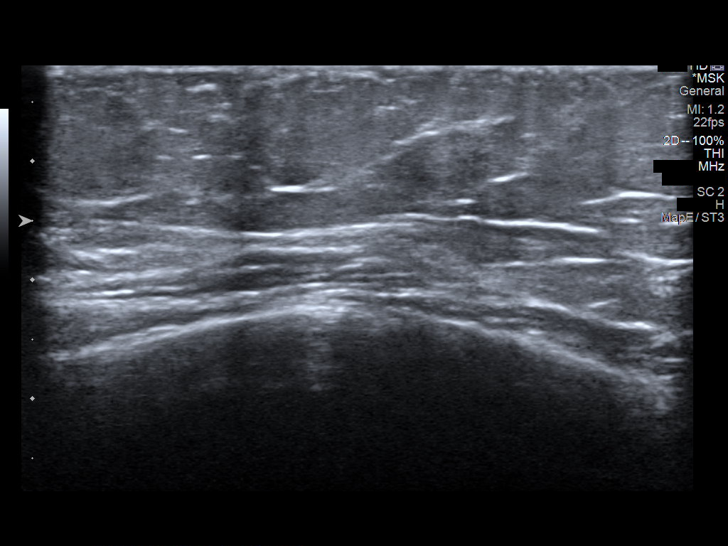

[12 of 12 positions shown; findings below may reference images not displayed]

FINDINGS: No defined mass. There is a isoechoic focal area that measures
approximately 3 cm in long axis, within the subcutaneous fat, that
may reflect a poorly defined lipoma or focal accumulation of fat.
This corresponds to area of the palpable abnormality.
IMPRESSION: 1. Subtle isoechoic focal area that may reflect a poorly defined
lipoma or focal accumulation of fat, noted in the anterior upper
chest corresponding to the palpable abnormality. Exam otherwise
unremarkable. If desired clinically, this could be further
assessed/characterized with soft tissue MRI without and with
contrast.

## 2022-10-02 DIAGNOSIS — F4320 Adjustment disorder, unspecified: Secondary | ICD-10-CM | POA: Diagnosis not present

## 2022-11-05 DIAGNOSIS — S161XXA Strain of muscle, fascia and tendon at neck level, initial encounter: Secondary | ICD-10-CM | POA: Diagnosis not present

## 2022-11-05 DIAGNOSIS — Z6827 Body mass index (BMI) 27.0-27.9, adult: Secondary | ICD-10-CM | POA: Diagnosis not present

## 2022-11-05 DIAGNOSIS — M958 Other specified acquired deformities of musculoskeletal system: Secondary | ICD-10-CM | POA: Diagnosis not present

## 2023-09-08 ENCOUNTER — Emergency Department (HOSPITAL_BASED_OUTPATIENT_CLINIC_OR_DEPARTMENT_OTHER)
Admission: EM | Admit: 2023-09-08 | Discharge: 2023-09-08 | Disposition: A | Discharge status: Home / Self Care | Payer: Worker's Compensation | Attending: Emergency Medicine | Admitting: Emergency Medicine

## 2023-09-08 ENCOUNTER — Encounter (HOSPITAL_BASED_OUTPATIENT_CLINIC_OR_DEPARTMENT_OTHER): Payer: Self-pay

## 2023-09-08 ENCOUNTER — Other Ambulatory Visit: Payer: Self-pay

## 2023-09-08 DIAGNOSIS — S61412A Laceration without foreign body of left hand, initial encounter: Secondary | ICD-10-CM | POA: Insufficient documentation

## 2023-09-08 DIAGNOSIS — W268XXA Contact with other sharp object(s), not elsewhere classified, initial encounter: Secondary | ICD-10-CM | POA: Diagnosis not present

## 2023-09-08 DIAGNOSIS — Y990 Civilian activity done for income or pay: Secondary | ICD-10-CM | POA: Diagnosis not present

## 2023-09-08 DIAGNOSIS — F17210 Nicotine dependence, cigarettes, uncomplicated: Secondary | ICD-10-CM | POA: Insufficient documentation

## 2023-09-08 DIAGNOSIS — S6992XA Unspecified injury of left wrist, hand and finger(s), initial encounter: Secondary | ICD-10-CM | POA: Diagnosis present

## 2023-09-08 MED ORDER — LIDOCAINE-EPINEPHRINE (PF) 2 %-1:200000 IJ SOLN
10.0000 mL | Freq: Once | INTRAMUSCULAR | Status: DC
  Filled 2023-09-08: qty 20

## 2023-09-08 NOTE — ED Notes (Signed)
 Reviewed AVS/discharge instruction with patient. Time allotted for and all questions answered. Patient is agreeable for d/c and escorted to ed exit by staff.

## 2023-09-08 NOTE — ED Triage Notes (Signed)
 Pt reports he was at work, cutting boxes & box cutter "came back & got my L hand." Tdap 2018

## 2023-09-08 NOTE — ED Provider Notes (Signed)
 [place] EMERGENCY DEPARTMENT AT [facility] Provider Note   CSN: [id number] Arrival date & time: 09/08/23  1625     History  No chief complaint on file.   Raymond Clark is a 33 y.o. male.  HPI   33 year old male presents emergency department with complaints of laceration on his left hand.  States that he works at [employer] and was attempting to break down boxes using a box cutter and accidentally cut the palm of his left hand.  States that the blade was switched out just prior to using it.  Denies any pain/trauma elsewhere.  Denies any weakness or sensory deficits distal to injury.  States his last Tdap was around 7 years ago.  No significant pertinent past medical history.  Home Medications Prior to Admission medications   Medication Sig Start Date End Date Taking? Authorizing Provider  acetaminophen  (TYLENOL ) 325 MG tablet Take 2 tablets (650 mg total) by mouth every 6 (six) hours as needed for mild pain or moderate pain. Patient not taking: Reported on 08/26/2016 08/18/16   Rober Chimera, MD  amphetamine -dextroamphetamine  (ADDERALL) 30 MG tablet Take 30 mg by mouth every morning.    [provider]  oxyCODONE  (ROXICODONE ) 5 MG immediate release tablet Take 1 tablet (5 mg total) by mouth every 6 (six) hours as needed for severe pain. Patient not taking: Reported on 08/26/2016 08/18/16   Rober Chimera, MD      Allergies    Patient has no known allergies.    Review of Systems   Review of Systems  All other systems reviewed and are negative.   Physical Exam Updated Vital Signs BP (!) 163/110 (BP Location: Right Arm)   Pulse (!) 105   Temp 98.1 F (36.7 C)   Resp 16   SpO2 100%  Physical Exam Vitals and nursing note reviewed.  Constitutional:      General: He is not in acute distress.    Appearance: He is well-developed.  HENT:     Head: Normocephalic and atraumatic.  Eyes:     Conjunctiva/sclera: Conjunctivae normal.   Cardiovascular:     Rate and Rhythm: Normal rate and regular rhythm.     Heart sounds: No murmur heard. Pulmonary:     Effort: Pulmonary effort is normal. No respiratory distress.     Breath sounds: Normal breath sounds.  Abdominal:     Palpations: Abdomen is soft.     Tenderness: There is no abdominal tenderness.  Musculoskeletal:        General: No swelling.       Hands:     Cervical back: Neck supple.     Comments: Full range of motion digits of left hand.  Laceration as above measuring 9.1 cm in length.  Laceration somewhat superficial in nature.  No obvious ligamentous/tendinous involvement.  Skin:    General: Skin is warm and dry.     Capillary Refill: Capillary refill takes less than 2 seconds.  Neurological:     Mental Status: He is alert.  Psychiatric:        Mood and Affect: Mood normal.     ED Results / Procedures / Treatments   Labs (all labs ordered are listed, but only abnormal results are displayed) Labs Reviewed - No data to display  EKG None  Radiology No results found.  Procedures .Laceration Repair  Date/Time: 09/08/2023 5:36 PM  Performed by: [place] Butter, PA Authorized by: [place] Butter, PA   Consent:  Consent obtained:  Verbal   Consent given by:  Patient   Risks, benefits, and alternatives were discussed: yes     Risks discussed:  Infection, need for additional repair and nerve damage   Alternatives discussed:  No treatment, delayed treatment and observation Universal protocol:    Procedure explained and questions answered to patient or proxy's satisfaction: yes     Patient identity confirmed:  Verbally with patient Anesthesia:    Anesthesia method:  Local infiltration   Local anesthetic:  Lidocaine  2% WITH epi Laceration details:    Location:  Hand   Hand location:  L palm   Length (cm):  9.1 Pre-procedure details:    Preparation:  Patient was prepped and draped in usual sterile fashion Exploration:    Limited defect  created (wound extended): no     Hemostasis achieved with:  Direct pressure   Imaging outcome: foreign body not noted     Wound exploration: wound explored through full range of motion and entire depth of wound visualized     Contaminated: no   Treatment:    Area cleansed with:  Saline   Amount of cleaning:  Standard   Irrigation solution:  Sterile water   Irrigation volume:  500cc   Irrigation method:  Syringe   Visualized foreign bodies/material removed: no     Debridement:  None   Undermining:  None   Scar revision: no   Skin repair:    Repair method:  Sutures   Suture size:  4-0   Suture material:  Prolene   Suture technique:  Simple interrupted   Number of sutures:  6 Approximation:    Approximation:  Close Repair type:    Repair type:  Intermediate Post-procedure details:    Dressing:  Non-adherent dressing   Procedure completion:  Tolerated well, no immediate complications     Medications Ordered in ED Medications  lidocaine -EPINEPHrine (XYLOCAINE  W/EPI) 2 %-1:200000 (PF) injection 10 mL (has no administration in time range)    ED Course/ Medical Decision Making/ A&P                                 Medical Decision Making Risk Prescription drug management.   This patient presents to the ED for concern of laceration, this involves an extensive number of treatment options, and is a complaint that carries with it a high risk of complications and morbidity.  The differential diagnosis includes fracture, dislocation, ligament/tendon injury, neurovascular compromise, other   Co morbidities that complicate the patient evaluation  See HPI   Additional history obtained:  Additional history obtained from EMR External records from outside source obtained and reviewed including hospital records   Lab Tests:  N/a   Imaging Studies ordered:  N/a   Cardiac Monitoring: / EKG:  N/a   Consultations Obtained:  N/a   Problem List / ED Course /  Critical interventions / Medication management  Hand laceration I ordered medication including lidocaine  with epinephrine  Reevaluation of the patient after these medicines showed that the patient improved I have reviewed the patients home medicines and have made adjustments as needed   Social Determinants of Health:  Chronic cigarette use.  Denies illicit drug use.   Test / Admission - Considered:  Hand laceration Vitals signs significant for initial hypertension, tachycardia. Otherwise within normal range and stable throughout visit. 33 year old male presents emergency department with complaints of laceration on his left hand.  States that he works at [employer] and was attempting to break down boxes using a box cutter and accidentally cut the palm of his left hand.  States that the blade was switched out just prior to using it.  Denies any pain/trauma elsewhere.  Denies any weakness or sensory deficits distal to injury.  States his last Tdap was around 7 years ago. On exam, somewhat superficial laceration appreciated on the palm of left hand as above.  No evidence of ligamentous/tendinous involvement.  Laceration cleaned and repaired in manner as above.  Will recommend local wound care at home and follow-up for suture removal as discussed in AVS.  Treatment plan discussed with patient and he acknowledged understanding was agreeable to said plan.  Patient will well-appearing, afebrile in no acute distress. Worrisome signs and symptoms were discussed with the patient, and the patient acknowledged understanding to return to the ED if noticed. Patient was stable upon discharge.          Final Clinical Impression(s) / ED Diagnoses Final diagnoses:  Laceration of left hand without foreign body, initial encounter    Rx / DC Orders ED Discharge Orders     None         Byram Butter, [place] 09/08/23 1737    Arvilla Birmingham, MD 09/08/23 1746

## 2023-09-08 NOTE — Discharge Instructions (Addendum)
 Wash area gently with warm soapy water.  Change bandages daily.  Follow-up for suture removal in 7 to 10 days.  You may take Tylenol /ibuprofen for pain.
